# Patient Record
Sex: Female | Born: 2005 | Race: White | Hispanic: No | Marital: Single | Smoking: Never smoker
Health system: Southern US, Community
[De-identification: ages and names within clinical notes are randomized; demographics above are authoritative.]

## PROBLEM LIST (undated history)

## (undated) DIAGNOSIS — L237 Allergic contact dermatitis due to plants, except food: Secondary | ICD-10-CM

## (undated) DIAGNOSIS — H6690 Otitis media, unspecified, unspecified ear: Secondary | ICD-10-CM

## (undated) DIAGNOSIS — T7840XA Allergy, unspecified, initial encounter: Secondary | ICD-10-CM

## (undated) HISTORY — DX: Allergy, unspecified, initial encounter: T78.40XA

## (undated) HISTORY — DX: Otitis media, unspecified, unspecified ear: H66.90

## (undated) HISTORY — DX: Allergic contact dermatitis due to plants, except food: L23.7

---
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
## 2023-08-24 ENCOUNTER — Ambulatory Visit
Admission: EM | Admit: 2023-08-24 | Discharge: 2023-08-24 | Disposition: A | Discharge status: Home / Self Care | Payer: No Typology Code available for payment source | Attending: Internal Medicine | Admitting: Internal Medicine

## 2023-08-24 DIAGNOSIS — F1290 Cannabis use, unspecified, uncomplicated: Secondary | ICD-10-CM | POA: Diagnosis not present

## 2023-08-24 DIAGNOSIS — R112 Nausea with vomiting, unspecified: Secondary | ICD-10-CM | POA: Diagnosis not present

## 2023-08-24 DIAGNOSIS — J208 Acute bronchitis due to other specified organisms: Secondary | ICD-10-CM | POA: Diagnosis not present

## 2023-08-24 LAB — POCT URINALYSIS DIP (MANUAL ENTRY)
Glucose, UA: NEGATIVE mg/dL
Ketones, POC UA: NEGATIVE mg/dL
Leukocytes, UA: NEGATIVE
Nitrite, UA: NEGATIVE
Protein Ur, POC: 100 mg/dL — AB
Spec Grav, UA: >=1.03 — AB (ref 1.010–1.025)
Urobilinogen, UA: 1 U/dL
pH, UA: 6 (ref 5.0–8.0)

## 2023-08-24 LAB — POCT URINE PREGNANCY: Preg Test, Ur: NEGATIVE

## 2023-08-24 MED ORDER — ONDANSETRON 4 MG PO TBDP: 4.0000 mg | ORAL_TABLET | Freq: Three times a day (TID) | ORAL | 0 refills | Status: AC | PRN

## 2023-08-24 MED ORDER — PROMETHAZINE-DM 6.25-15 MG/5ML PO SYRP: 5.0000 mL | ORAL_SOLUTION | Freq: Three times a day (TID) | ORAL | 0 refills | Status: AC | PRN

## 2023-08-24 MED ORDER — ONDANSETRON HCL 4 MG/2ML IJ SOLN
4.0000 mg | Freq: Once | INTRAMUSCULAR | Status: AC
  Administered 2023-08-24: 4 mg via INTRAMUSCULAR

## 2023-08-24 MED ORDER — METHYLPREDNISOLONE ACETATE 40 MG/ML IJ SUSP
40.0000 mg | Freq: Once | INTRAMUSCULAR | Status: AC
  Administered 2023-08-24: 40 mg via INTRAMUSCULAR

## 2023-08-24 NOTE — ED Triage Notes (Signed)
Pt c/o cough, head/chest congestion, fever, body aches, n/v x 4 days-NAD-steady gait

## 2023-08-24 NOTE — ED Provider Notes (Signed)
[facility]  Note:  This document was prepared using Conservation officer, historic buildings and may include unintentional dictation errors.  MRN: [id number] DOB: [birth year]  Subjective:   Laura Leon is a 17 y.o. female presenting for 4-day history of acute onset persistent moderate to severe malaise, sinus and chest congestion, productive cough, body pains, fever.  Has now started to have 2 days of intractable nausea, vomiting.  Is finding it difficult to hold anything down.  LMP was early November, about 6 weeks ago.  No history of asthma.  Has allergic rhinitis.  Smokes marijuana daily.  No current facility-administered medications for this encounter.  Current Outpatient Medications:    diphenhydrAMINE (BENADRYL) 12.5 MG/5ML liquid, Take 12.5 mg by mouth 4 (four) times daily as needed., Disp: , Rfl:    loratadine (CLARITIN) 5 MG chewable tablet, Chew 5 mg by mouth daily., Disp: , Rfl:    Allergies  Allergen Reactions   Corn-Containing Products    Egg-Derived Products    Milk-Related Compounds    Peanut-Containing Drug Products    Penicillins Other (See Comments)    Intolerance     Past Medical History:  Diagnosis Date   Allergy    Otitis media    Recurrent when younger   Poison ivy    very sensitive, gets  a bad case     History reviewed. No pertinent surgical history.  Family History  Problem Relation Age of Onset   Thyroid disease Mother    Kidney disease Mother        sponge kidney's   Diabetes Paternal Aunt    Hyperlipidemia Maternal Grandmother    Cancer Paternal Grandfather        lymphoma   Diabetes Paternal Aunt     Social History   Tobacco Use   Smoking status: Never   Smokeless tobacco: Never  Vaping Use   Vaping status: Every Day  Substance Use Topics   Alcohol use: Yes    Comment: occ   Drug use: Yes    Types: Marijuana    ROS   Objective:   Vitals: BP 120/73 (BP Location: Left Arm)   Pulse 61   Temp 98.1  F (36.7 C) (Oral)   Resp 20   LMP  (LMP Unknown) Comment: states she had a change in BCP  SpO2 98%   Physical Exam Constitutional:      General: She is not in acute distress.    Appearance: Normal appearance. She is well-developed. She is not ill-appearing, toxic-appearing or diaphoretic.  HENT:     Head: Normocephalic and atraumatic.     Right Ear: External ear normal.     Left Ear: External ear normal.     Nose: Nose normal.     Mouth/Throat:     Mouth: Mucous membranes are moist.     Pharynx: No pharyngeal swelling, oropharyngeal exudate, posterior oropharyngeal erythema or uvula swelling.     Tonsils: No tonsillar exudate or tonsillar abscesses. 0 on the right. 0 on the left.  Eyes:     General: No scleral icterus.       Right eye: No discharge.        Left eye: No discharge.     Extraocular Movements: Extraocular movements intact.  Cardiovascular:     Rate and Rhythm: Normal rate and regular rhythm.     Heart sounds: Normal heart sounds. No murmur heard.    No friction rub. No gallop.  Pulmonary:  Effort: Pulmonary effort is normal. No respiratory distress.     Breath sounds: No stridor. No wheezing, rhonchi or rales.  Chest:     Chest wall: No tenderness.  Skin:    General: Skin is warm and dry.  Neurological:     General: No focal deficit present.     Mental Status: She is alert and oriented to person, place, and time.  Psychiatric:        Mood and Affect: Mood normal.        Behavior: Behavior normal.    Results for orders placed or performed during the hospital encounter of 08/24/23 (from the past 24 hours)  POCT urine pregnancy     Status: None   Collection Time: 08/24/23 12:08 PM  Result Value Ref Range   Preg Test, Ur Negative Negative  POCT urinalysis dipstick     Status: Abnormal   Collection Time: 08/24/23 12:08 PM  Result Value Ref Range   Color, UA yellow yellow   Clarity, UA cloudy (A) clear   Glucose, UA negative negative mg/dL   Bilirubin,  UA small (A) negative   Ketones, POC UA negative negative mg/dL   Spec Grav, UA >=1.610 (A) 1.010 - 1.025   Blood, UA moderate (A) negative   pH, UA 6.0 5.0 - 8.0   Protein Ur, POC =100 (A) negative mg/dL   Urobilinogen, UA 1.0 0.2 or 1.0 E.U./dL   Nitrite, UA Negative Negative   Leukocytes, UA Negative Negative   IM Depo-Medrol 40 mg administered in clinic.  IM Zofran 4 mg administered in clinic.  Assessment and Plan :   PDMP not reviewed this encounter.  1. Acute viral bronchitis   2. Nausea and vomiting, unspecified vomiting type   3. Marijuana use    Suspect concurrent viral bronchitis and developing cannabinoid induced hyperemesis syndrome.  Recommended measures as above, supportive care.  Counseled patient on potential for adverse effects with medications prescribed/recommended today, ER and return-to-clinic precautions discussed, patient verbalized understanding.    Wallis Bamberg, PA-C 08/24/23 1539

## 2023-08-24 NOTE — Discharge Instructions (Addendum)
Avoid marijuana use as much as possible. Use cough syrup as needed. Tylenol for fevers. Make sure you push fluids drinking mostly water but mix it with Gatorade.  Try to eat light meals including soups, broths and soft foods, fruits.  You may use Zofran for your nausea and vomiting once every 8 hours. Please return to the clinic if symptoms worsen or you start having severe abdominal pain not helped by taking Tylenol or start having bloody stools or blood in the vomit.
